# Patient Record
Sex: Female | Born: 1960 | Race: Black or African American | Hispanic: No | Marital: Married | Smoking: Never smoker
Health system: Southern US, Community
[De-identification: ages and names within clinical notes are randomized; demographics above are authoritative.]

## PROBLEM LIST (undated history)

## (undated) DIAGNOSIS — K219 Gastro-esophageal reflux disease without esophagitis: Secondary | ICD-10-CM

## (undated) DIAGNOSIS — I10 Essential (primary) hypertension: Secondary | ICD-10-CM

## (undated) HISTORY — DX: Essential (primary) hypertension: I10

## (undated) HISTORY — PX: CHOLECYSTECTOMY: SHX55

## (undated) HISTORY — DX: Gastro-esophageal reflux disease without esophagitis: K21.9

---

## 1998-11-21 ENCOUNTER — Other Ambulatory Visit: Admission: RE | Admit: 1998-11-21 | Discharge: 1998-11-21 | Payer: Self-pay | Admitting: Family Medicine

## 1999-11-27 ENCOUNTER — Other Ambulatory Visit: Admission: RE | Admit: 1999-11-27 | Discharge: 1999-11-27 | Payer: Self-pay | Admitting: Family Medicine

## 2000-09-03 ENCOUNTER — Encounter: Payer: Self-pay | Admitting: Family Medicine

## 2000-09-03 ENCOUNTER — Ambulatory Visit (HOSPITAL_COMMUNITY): Admission: RE | Admit: 2000-09-03 | Discharge: 2000-09-03 | Payer: Self-pay | Admitting: Family Medicine

## 2001-09-07 ENCOUNTER — Ambulatory Visit (HOSPITAL_COMMUNITY): Admission: RE | Admit: 2001-09-07 | Discharge: 2001-09-07 | Payer: Self-pay | Admitting: Family Medicine

## 2001-09-07 ENCOUNTER — Encounter: Payer: Self-pay | Admitting: Emergency Medicine

## 2001-11-17 ENCOUNTER — Encounter: Payer: Self-pay | Admitting: Family Medicine

## 2001-11-17 ENCOUNTER — Encounter: Admission: RE | Admit: 2001-11-17 | Discharge: 2001-11-17 | Payer: Self-pay | Admitting: Family Medicine

## 2002-09-13 ENCOUNTER — Encounter: Payer: Self-pay | Admitting: Emergency Medicine

## 2002-09-13 ENCOUNTER — Ambulatory Visit (HOSPITAL_COMMUNITY): Admission: RE | Admit: 2002-09-13 | Discharge: 2002-09-13 | Payer: Self-pay | Admitting: Emergency Medicine

## 2003-08-18 ENCOUNTER — Encounter: Admission: RE | Admit: 2003-08-18 | Discharge: 2003-09-07 | Payer: Self-pay | Admitting: Nurse Practitioner

## 2003-09-14 ENCOUNTER — Ambulatory Visit (HOSPITAL_COMMUNITY): Admission: RE | Admit: 2003-09-14 | Discharge: 2003-09-14 | Payer: Self-pay | Admitting: Emergency Medicine

## 2003-12-01 ENCOUNTER — Other Ambulatory Visit: Admission: RE | Admit: 2003-12-01 | Discharge: 2003-12-01 | Payer: Self-pay | Admitting: Emergency Medicine

## 2004-09-14 ENCOUNTER — Ambulatory Visit (HOSPITAL_COMMUNITY): Admission: RE | Admit: 2004-09-14 | Discharge: 2004-09-14 | Payer: Self-pay | Admitting: Emergency Medicine

## 2004-12-05 ENCOUNTER — Other Ambulatory Visit: Admission: RE | Admit: 2004-12-05 | Discharge: 2004-12-05 | Payer: Self-pay | Admitting: Emergency Medicine

## 2005-09-16 ENCOUNTER — Ambulatory Visit (HOSPITAL_COMMUNITY): Admission: RE | Admit: 2005-09-16 | Discharge: 2005-09-16 | Payer: Self-pay | Admitting: Emergency Medicine

## 2006-02-10 ENCOUNTER — Other Ambulatory Visit: Admission: RE | Admit: 2006-02-10 | Discharge: 2006-02-10 | Payer: Self-pay | Admitting: Emergency Medicine

## 2006-02-11 ENCOUNTER — Encounter: Admission: RE | Admit: 2006-02-11 | Discharge: 2006-02-11 | Payer: Self-pay | Admitting: Emergency Medicine

## 2006-09-18 ENCOUNTER — Ambulatory Visit (HOSPITAL_COMMUNITY): Admission: RE | Admit: 2006-09-18 | Discharge: 2006-09-18 | Payer: Self-pay | Admitting: Emergency Medicine

## 2007-02-18 ENCOUNTER — Other Ambulatory Visit: Admission: RE | Admit: 2007-02-18 | Discharge: 2007-02-18 | Payer: Self-pay | Admitting: Emergency Medicine

## 2007-09-22 ENCOUNTER — Ambulatory Visit (HOSPITAL_COMMUNITY): Admission: RE | Admit: 2007-09-22 | Discharge: 2007-09-22 | Payer: Self-pay | Admitting: Emergency Medicine

## 2008-02-23 ENCOUNTER — Other Ambulatory Visit: Admission: RE | Admit: 2008-02-23 | Discharge: 2008-02-23 | Payer: Self-pay | Admitting: Emergency Medicine

## 2008-09-22 ENCOUNTER — Ambulatory Visit (HOSPITAL_COMMUNITY): Admission: RE | Admit: 2008-09-22 | Discharge: 2008-09-22 | Payer: Self-pay | Admitting: Family Medicine

## 2009-03-01 ENCOUNTER — Other Ambulatory Visit: Admission: RE | Admit: 2009-03-01 | Discharge: 2009-03-01 | Payer: Self-pay | Admitting: Emergency Medicine

## 2009-09-25 ENCOUNTER — Ambulatory Visit (HOSPITAL_COMMUNITY): Admission: RE | Admit: 2009-09-25 | Discharge: 2009-09-25 | Payer: Self-pay | Admitting: Family Medicine

## 2009-10-25 ENCOUNTER — Encounter: Admission: RE | Admit: 2009-10-25 | Discharge: 2009-10-25 | Payer: Self-pay | Admitting: *Deleted

## 2010-03-02 ENCOUNTER — Other Ambulatory Visit: Admission: RE | Admit: 2010-03-02 | Discharge: 2010-03-02 | Payer: Self-pay | Admitting: Family Medicine

## 2010-08-20 ENCOUNTER — Other Ambulatory Visit (HOSPITAL_COMMUNITY): Payer: Self-pay | Admitting: Family Medicine

## 2010-08-20 DIAGNOSIS — Z1231 Encounter for screening mammogram for malignant neoplasm of breast: Secondary | ICD-10-CM

## 2010-08-28 ENCOUNTER — Other Ambulatory Visit: Payer: Self-pay | Admitting: Gastroenterology

## 2010-09-27 ENCOUNTER — Ambulatory Visit (HOSPITAL_COMMUNITY)
Admission: RE | Admit: 2010-09-27 | Discharge: 2010-09-27 | Disposition: A | Discharge status: Home / Self Care | Payer: BC Managed Care – PPO | Source: Ambulatory Visit | Attending: Family Medicine | Admitting: Family Medicine

## 2010-09-27 DIAGNOSIS — Z1231 Encounter for screening mammogram for malignant neoplasm of breast: Secondary | ICD-10-CM

## 2011-03-04 ENCOUNTER — Other Ambulatory Visit (HOSPITAL_COMMUNITY)
Admission: RE | Admit: 2011-03-04 | Discharge: 2011-03-04 | Disposition: A | Discharge status: Home / Self Care | Payer: BC Managed Care – PPO | Source: Ambulatory Visit | Attending: Family Medicine | Admitting: Family Medicine

## 2011-03-04 ENCOUNTER — Other Ambulatory Visit: Payer: Self-pay

## 2011-03-04 DIAGNOSIS — Z01419 Encounter for gynecological examination (general) (routine) without abnormal findings: Secondary | ICD-10-CM | POA: Insufficient documentation

## 2011-09-03 ENCOUNTER — Other Ambulatory Visit (HOSPITAL_COMMUNITY): Payer: Self-pay | Admitting: Family Medicine

## 2011-09-03 DIAGNOSIS — Z1231 Encounter for screening mammogram for malignant neoplasm of breast: Secondary | ICD-10-CM

## 2011-09-30 ENCOUNTER — Ambulatory Visit (HOSPITAL_COMMUNITY)
Admission: RE | Admit: 2011-09-30 | Discharge: 2011-09-30 | Disposition: A | Discharge status: Home / Self Care | Payer: BC Managed Care – PPO | Source: Ambulatory Visit | Attending: Family Medicine | Admitting: Family Medicine

## 2011-09-30 DIAGNOSIS — Z1231 Encounter for screening mammogram for malignant neoplasm of breast: Secondary | ICD-10-CM | POA: Insufficient documentation

## 2012-01-09 IMAGING — MG MM DIGITAL SCREENING
4 series · 4 of 4 positions shown · non-contrast
Comparison: Prior studies.

DG SCREEN MAMMOGRAM BILATERAL
Bilateral CC and MLO view(s) were taken.
Prior study comparison: September 18, 2006, DG screen mammogram bilateral.

DIGITAL SCREENING MAMMOGRAM WITH CAD:

[R CC]
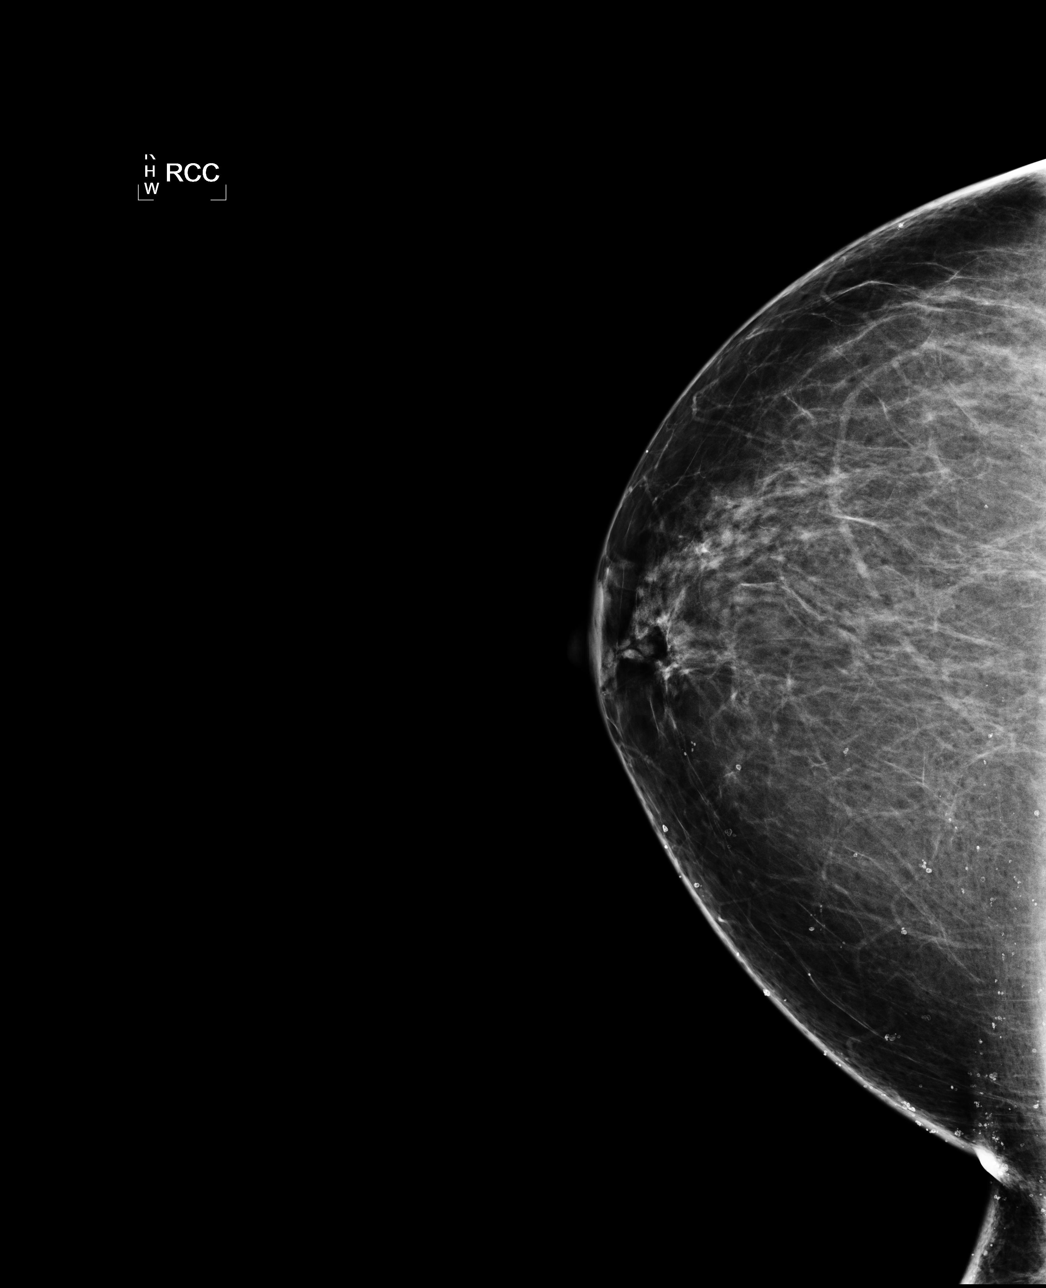

[R MLO]
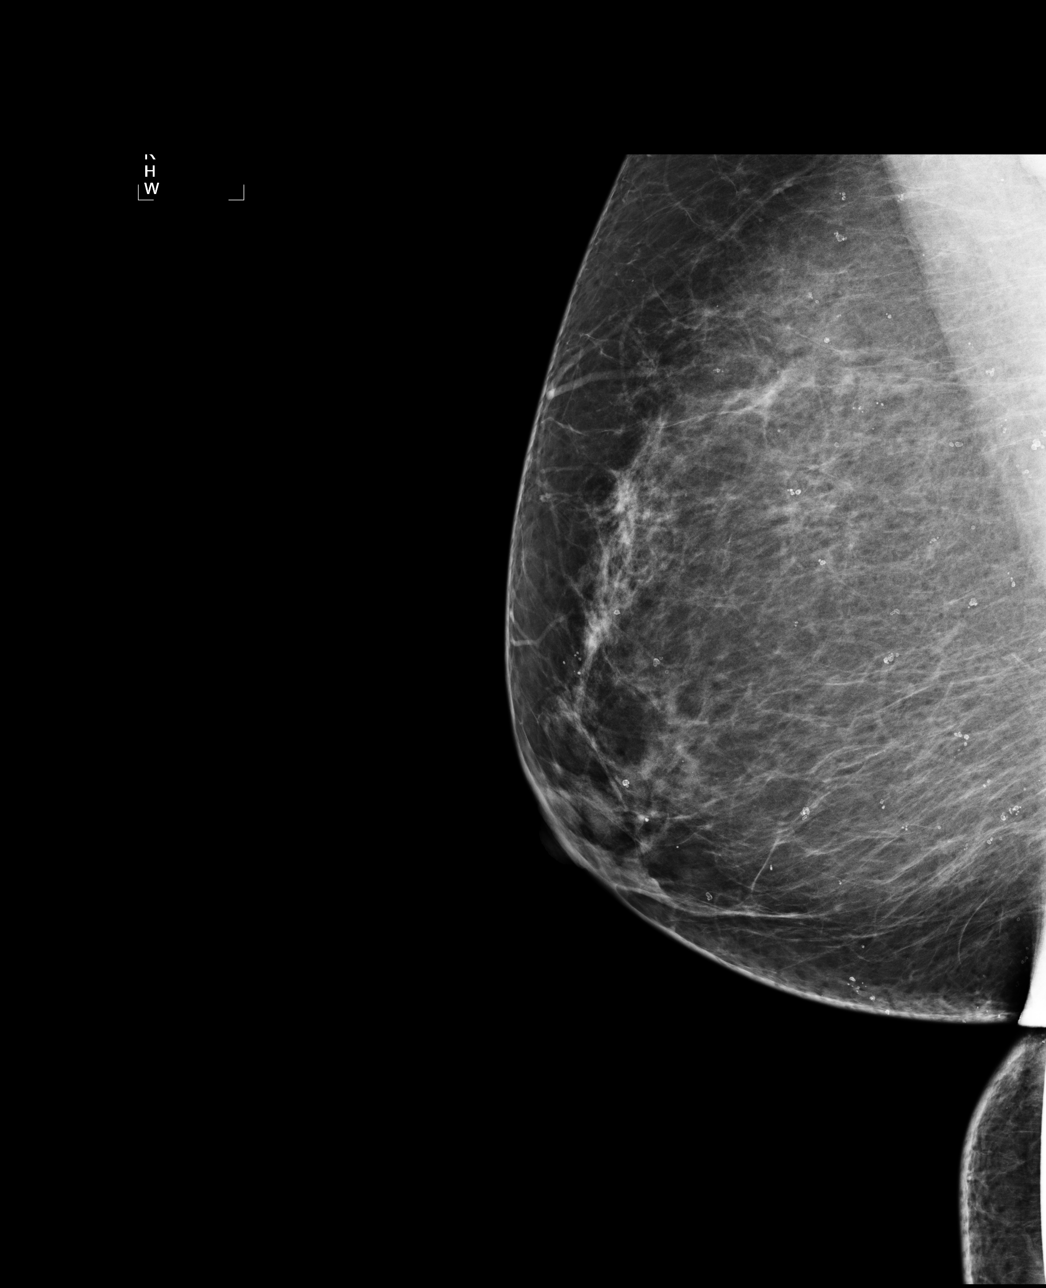

[L CC]
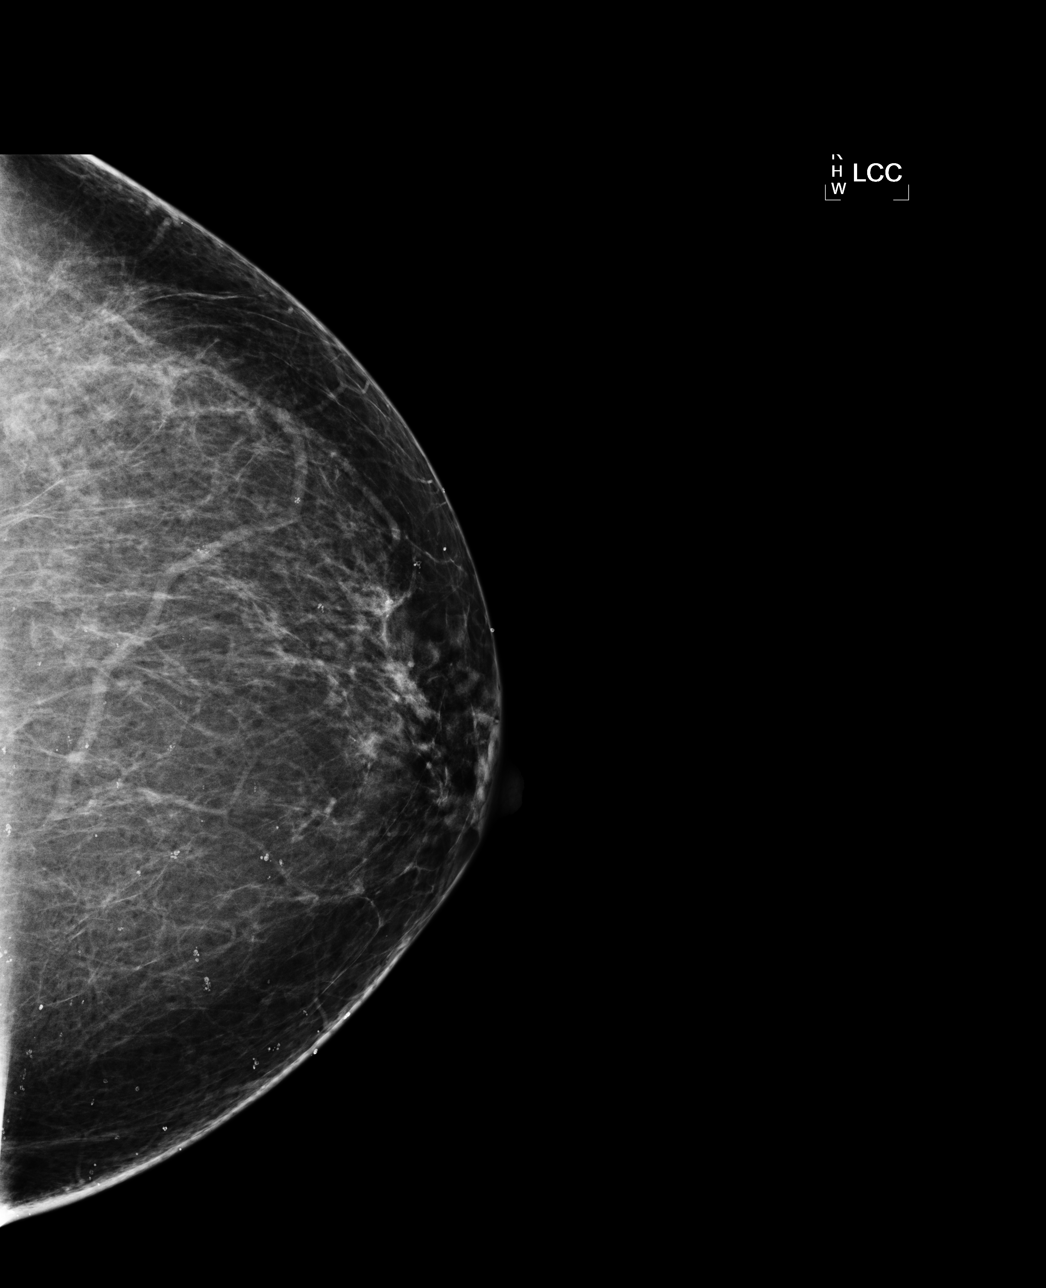

[L MLO]
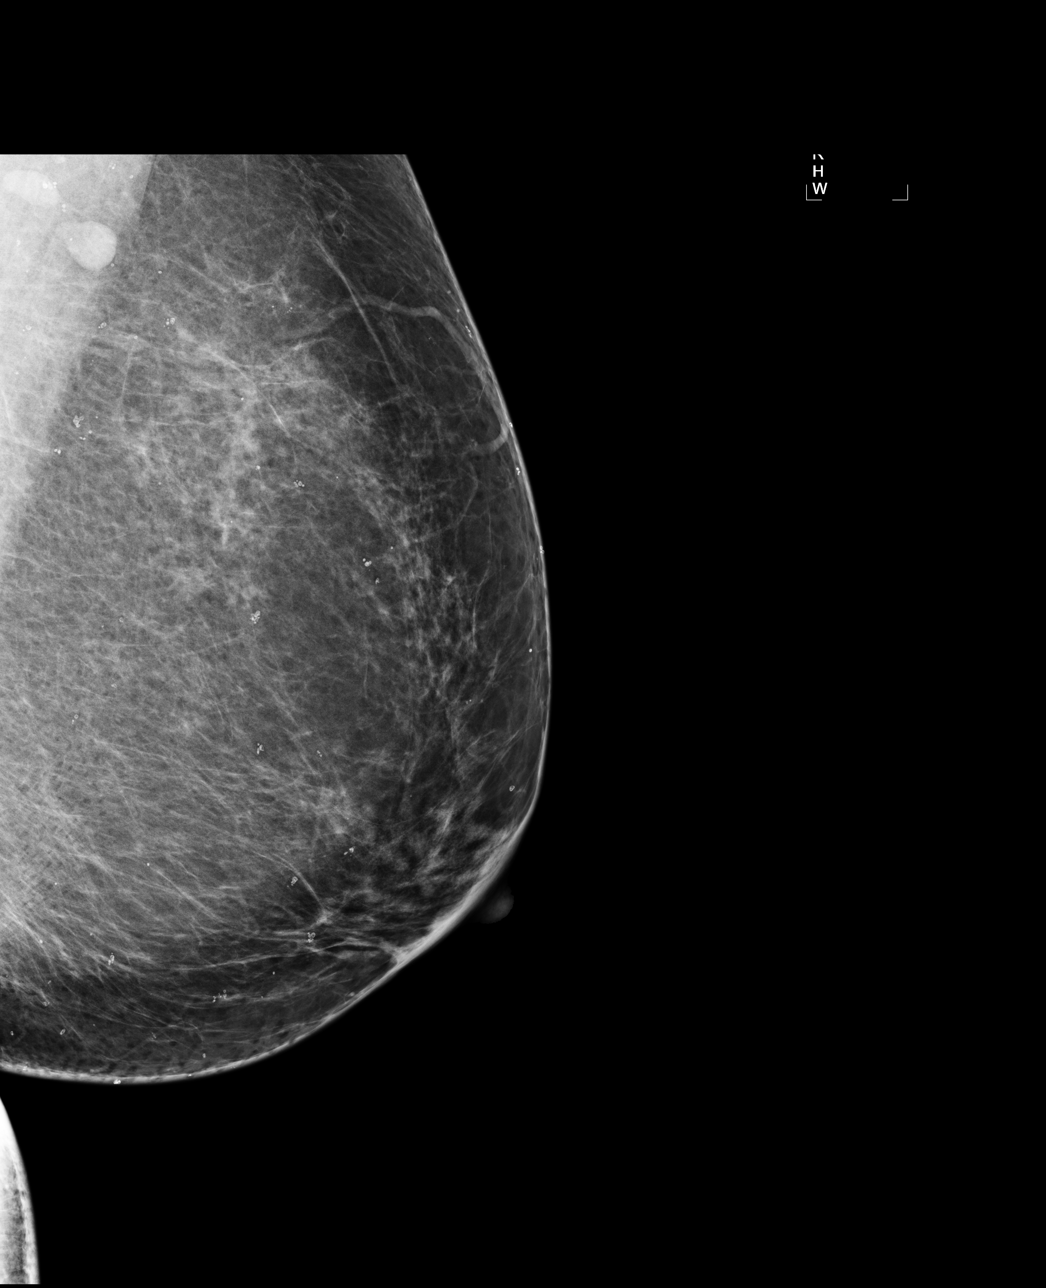

[4 of 4 positions shown; findings below may reference images not displayed]

The breast tissue is almost entirely fatty.  There is no dominant mass, architectural distortion or
calcification to suggest malignancy.

Images were processed with CAD.
IMPRESSION: No mammographic evidence of malignancy.  Suggest yearly screening mammography.

A result letter of this screening mammogram will be mailed directly to the patient.

ASSESSMENT: Negative - BI-RADS 1

Screening mammogram in 1 year.
,

## 2012-03-04 ENCOUNTER — Other Ambulatory Visit (HOSPITAL_COMMUNITY)
Admission: RE | Admit: 2012-03-04 | Discharge: 2012-03-04 | Disposition: A | Discharge status: Home / Self Care | Payer: BC Managed Care – PPO | Source: Ambulatory Visit | Attending: Family Medicine | Admitting: Family Medicine

## 2012-03-04 ENCOUNTER — Other Ambulatory Visit: Payer: Self-pay | Admitting: Family Medicine

## 2012-03-04 DIAGNOSIS — Z01419 Encounter for gynecological examination (general) (routine) without abnormal findings: Secondary | ICD-10-CM | POA: Insufficient documentation

## 2012-08-25 ENCOUNTER — Other Ambulatory Visit (HOSPITAL_COMMUNITY): Payer: Self-pay | Admitting: Family Medicine

## 2012-08-25 DIAGNOSIS — Z1231 Encounter for screening mammogram for malignant neoplasm of breast: Secondary | ICD-10-CM

## 2012-09-30 ENCOUNTER — Ambulatory Visit (HOSPITAL_COMMUNITY)
Admission: RE | Admit: 2012-09-30 | Discharge: 2012-09-30 | Disposition: A | Discharge status: Home / Self Care | Payer: BC Managed Care – PPO | Source: Ambulatory Visit | Attending: Family Medicine | Admitting: Family Medicine

## 2012-09-30 DIAGNOSIS — Z1231 Encounter for screening mammogram for malignant neoplasm of breast: Secondary | ICD-10-CM

## 2012-12-28 ENCOUNTER — Ambulatory Visit
Admission: RE | Admit: 2012-12-28 | Discharge: 2012-12-28 | Disposition: A | Discharge status: Home / Self Care | Payer: BC Managed Care – PPO | Source: Ambulatory Visit | Attending: Family Medicine | Admitting: Family Medicine

## 2012-12-28 ENCOUNTER — Other Ambulatory Visit: Payer: Self-pay | Admitting: Family Medicine

## 2012-12-28 DIAGNOSIS — M545 Low back pain: Secondary | ICD-10-CM

## 2013-01-28 ENCOUNTER — Ambulatory Visit: Payer: BC Managed Care – PPO | Attending: Family Medicine | Admitting: Physical Therapy

## 2013-01-28 DIAGNOSIS — IMO0001 Reserved for inherently not codable concepts without codable children: Secondary | ICD-10-CM | POA: Insufficient documentation

## 2013-01-28 DIAGNOSIS — M25559 Pain in unspecified hip: Secondary | ICD-10-CM | POA: Insufficient documentation

## 2013-01-28 DIAGNOSIS — M25569 Pain in unspecified knee: Secondary | ICD-10-CM | POA: Insufficient documentation

## 2013-02-17 ENCOUNTER — Encounter: Payer: BC Managed Care – PPO | Admitting: Physical Therapy

## 2013-02-24 ENCOUNTER — Encounter: Payer: BC Managed Care – PPO | Admitting: Physical Therapy

## 2013-03-05 ENCOUNTER — Other Ambulatory Visit (HOSPITAL_COMMUNITY)
Admission: RE | Admit: 2013-03-05 | Discharge: 2013-03-05 | Disposition: A | Discharge status: Home / Self Care | Payer: BC Managed Care – PPO | Source: Ambulatory Visit | Attending: Family Medicine | Admitting: Family Medicine

## 2013-03-05 ENCOUNTER — Other Ambulatory Visit: Payer: Self-pay | Admitting: Family Medicine

## 2013-03-05 DIAGNOSIS — Z01419 Encounter for gynecological examination (general) (routine) without abnormal findings: Secondary | ICD-10-CM | POA: Insufficient documentation

## 2013-08-23 ENCOUNTER — Other Ambulatory Visit (HOSPITAL_COMMUNITY): Payer: Self-pay | Admitting: Family Medicine

## 2013-08-23 DIAGNOSIS — Z1231 Encounter for screening mammogram for malignant neoplasm of breast: Secondary | ICD-10-CM

## 2013-09-09 ENCOUNTER — Other Ambulatory Visit: Payer: Self-pay | Admitting: Family

## 2013-09-09 DIAGNOSIS — R198 Other specified symptoms and signs involving the digestive system and abdomen: Secondary | ICD-10-CM

## 2013-09-10 ENCOUNTER — Ambulatory Visit
Admission: RE | Admit: 2013-09-10 | Discharge: 2013-09-10 | Disposition: A | Discharge status: Home / Self Care | Payer: BC Managed Care – PPO | Source: Ambulatory Visit | Attending: Family | Admitting: Family

## 2013-09-10 DIAGNOSIS — R198 Other specified symptoms and signs involving the digestive system and abdomen: Secondary | ICD-10-CM

## 2013-10-01 ENCOUNTER — Ambulatory Visit (HOSPITAL_COMMUNITY)
Admission: RE | Admit: 2013-10-01 | Discharge: 2013-10-01 | Disposition: A | Discharge status: Home / Self Care | Payer: BC Managed Care – PPO | Source: Ambulatory Visit | Attending: Family Medicine | Admitting: Family Medicine

## 2013-10-01 DIAGNOSIS — Z1231 Encounter for screening mammogram for malignant neoplasm of breast: Secondary | ICD-10-CM | POA: Insufficient documentation

## 2013-10-05 ENCOUNTER — Other Ambulatory Visit: Payer: Self-pay | Admitting: Gastroenterology

## 2014-03-08 ENCOUNTER — Other Ambulatory Visit: Payer: Self-pay | Admitting: Family Medicine

## 2014-03-08 ENCOUNTER — Other Ambulatory Visit (HOSPITAL_COMMUNITY)
Admission: RE | Admit: 2014-03-08 | Discharge: 2014-03-08 | Disposition: A | Discharge status: Home / Self Care | Payer: BC Managed Care – PPO | Source: Ambulatory Visit | Attending: Family Medicine | Admitting: Family Medicine

## 2014-03-08 DIAGNOSIS — Z0000 Encounter for general adult medical examination without abnormal findings: Secondary | ICD-10-CM | POA: Insufficient documentation

## 2014-03-09 LAB — CYTOLOGY - PAP

## 2014-09-05 ENCOUNTER — Other Ambulatory Visit (HOSPITAL_COMMUNITY): Payer: Self-pay | Admitting: Family Medicine

## 2014-09-05 DIAGNOSIS — Z1231 Encounter for screening mammogram for malignant neoplasm of breast: Secondary | ICD-10-CM

## 2014-10-05 ENCOUNTER — Ambulatory Visit (HOSPITAL_COMMUNITY)
Admission: RE | Admit: 2014-10-05 | Discharge: 2014-10-05 | Disposition: A | Discharge status: Home / Self Care | Payer: BC Managed Care – PPO | Source: Ambulatory Visit | Attending: Family Medicine | Admitting: Family Medicine

## 2014-10-05 DIAGNOSIS — Z1231 Encounter for screening mammogram for malignant neoplasm of breast: Secondary | ICD-10-CM | POA: Diagnosis present

## 2015-04-26 ENCOUNTER — Encounter: Payer: Self-pay | Admitting: Dietician

## 2015-04-26 ENCOUNTER — Encounter: Payer: BC Managed Care – PPO | Attending: Family Medicine | Admitting: Dietician

## 2015-04-26 VITALS — Ht 62.0 in | Wt 203.0 lb

## 2015-04-26 DIAGNOSIS — E669 Obesity, unspecified: Secondary | ICD-10-CM | POA: Diagnosis not present

## 2015-04-26 DIAGNOSIS — Z6837 Body mass index (BMI) 37.0-37.9, adult: Secondary | ICD-10-CM | POA: Insufficient documentation

## 2015-04-26 DIAGNOSIS — Z713 Dietary counseling and surveillance: Secondary | ICD-10-CM | POA: Diagnosis not present

## 2015-04-26 NOTE — Patient Instructions (Addendum)
-  Try to pair a carbohydrate snacks with a protein choice (examples fruit with nuts/ cheese or yogurt)  - Mid morning snack of orange juice and protein, or banana and protein -Try to replace the regular soda with a diet soda a few times a week  -Try to focus on having half your plate filled with non-starchy vegetables -Try to make it a priority to snack during the work day - Make the fried foods more of a treat (examples chips, fried chicken, french fries)  - Try to only have Chic Fila twice a week -Try to have three meals a day

## 2015-04-26 NOTE — Progress Notes (Signed)
  Medical Nutrition Therapy:  Appt start time: 1405 end time:  1500.   Assessment:  Primary concerns today: Aram BeechamCynthia is here today because her PCP referred here for obesity. She stated that she did not know why she was referred. She didn't state any specific weight goal or made any changes to her diet recently.  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 She does a lot of walking at work and is on her feet a lot. She has difficulty find time to eat at work. She lives with her husband and son. She ends up eating alone because of their work schedules.She doesn't do a lot of cooking.  She states doesn't feel hunger throughout the day but has a goal of not going so long without eating. She is open to improving her diet.    Preferred Learning Style:   No preference indicated   Learning Readiness:  Ready   MEDICATIONS: See list   DIETARY INTAKE:    Avoided foods include doesn't drink milk, tomato products, GERD beans, broccoli.    24-hr recall:  B (545 AM): instant Oatmeal, nutrigrain bar ( weekend fixes breakfast of toast eggs grits and [place] bacon)  Snk ( 745 AM): Chic fila- chicken chicken mini biscuit or [employer] breakfast sandwhich eats only part of the bun  L ( 1230-1PM): 1/2 of a sub (sub of the day) with chips sometimes, left overs  Snk ( PM): fruit snacks  and drinks water, cereal D ( 7 PM): Peanut/ pisatchos (1.4oz ) and drinks water or cereal Snk ( PM): none Beverages: water, soda regular 1 a day 8 oz, decaf coffee french vanilla stevia/ spelnda, orange juice   one meal a day out at least.   Usual physical activity: Walk at work  Estimated energy needs: 1600-1800 calories 180 g carbohydrates 120 g protein 44 g fat  Progress Towards Goal(s):  In progress.   Nutritional Diagnosis:  [place]-3.3 Overweight/obesity As related to history of daily fast food meals and sugar sweetend beverages .  As evidenced by 24 hour food recall and her  BMI of 37.    Intervention:  Nutrition counseling provided.  Goals:  -Try to pair a carbohydrate snacks with a protein choice (examples fruit with nuts/ cheese or yogurt)  - Mid morning snack of orange juice and protein, or banana and protein -Try to replace the regular soda with a diet soda a few times a week  -Try to focus on having half your plate filled with non-starchy vegetables -Try to make it a priority to snack during the work day - Make the fried foods more of a treat (examples chips, fried chicken, french fries)  - Try to only have Chic Fila twice a week -Try to have three meals a day   Teaching Method Utilized: Visual Auditory   Handouts given during visit include:  Myplate   Snack list   Meal planning card   Barriers to learning/adherence to lifestyle change: none  Demonstrated degree of understanding via:  Teach Back   Monitoring/Evaluation:  Dietary intake, exercise, and body weight prn.

## 2015-05-01 ENCOUNTER — Other Ambulatory Visit: Payer: Self-pay | Admitting: Obstetrics and Gynecology

## 2015-05-01 ENCOUNTER — Other Ambulatory Visit (HOSPITAL_COMMUNITY)
Admission: RE | Admit: 2015-05-01 | Discharge: 2015-05-01 | Disposition: A | Discharge status: Home / Self Care | Payer: BC Managed Care – PPO | Source: Ambulatory Visit | Attending: Obstetrics and Gynecology | Admitting: Obstetrics and Gynecology

## 2015-05-01 DIAGNOSIS — Z01419 Encounter for gynecological examination (general) (routine) without abnormal findings: Secondary | ICD-10-CM | POA: Insufficient documentation

## 2015-05-01 DIAGNOSIS — Z1151 Encounter for screening for human papillomavirus (HPV): Secondary | ICD-10-CM | POA: Insufficient documentation

## 2015-05-02 LAB — CYTOLOGY - PAP

## 2015-08-29 ENCOUNTER — Other Ambulatory Visit: Payer: Self-pay

## 2015-08-29 DIAGNOSIS — Z1231 Encounter for screening mammogram for malignant neoplasm of breast: Secondary | ICD-10-CM

## 2015-10-06 ENCOUNTER — Ambulatory Visit
Admission: RE | Admit: 2015-10-06 | Discharge: 2015-10-06 | Disposition: A | Discharge status: Home / Self Care | Payer: BC Managed Care – PPO | Source: Ambulatory Visit

## 2015-10-06 DIAGNOSIS — Z1231 Encounter for screening mammogram for malignant neoplasm of breast: Secondary | ICD-10-CM

## 2015-10-09 ENCOUNTER — Other Ambulatory Visit: Payer: Self-pay | Admitting: Family Medicine

## 2015-10-09 DIAGNOSIS — R928 Other abnormal and inconclusive findings on diagnostic imaging of breast: Secondary | ICD-10-CM

## 2015-10-16 ENCOUNTER — Ambulatory Visit
Admission: RE | Admit: 2015-10-16 | Discharge: 2015-10-16 | Disposition: A | Discharge status: Home / Self Care | Payer: BC Managed Care – PPO | Source: Ambulatory Visit | Attending: Family Medicine | Admitting: Family Medicine

## 2015-10-16 DIAGNOSIS — R928 Other abnormal and inconclusive findings on diagnostic imaging of breast: Secondary | ICD-10-CM

## 2016-06-21 DIAGNOSIS — J069 Acute upper respiratory infection, unspecified: Secondary | ICD-10-CM | POA: Diagnosis not present

## 2016-07-11 DIAGNOSIS — R202 Paresthesia of skin: Secondary | ICD-10-CM | POA: Diagnosis not present

## 2016-07-20 DIAGNOSIS — J029 Acute pharyngitis, unspecified: Secondary | ICD-10-CM | POA: Diagnosis not present

## 2016-08-07 DIAGNOSIS — M25519 Pain in unspecified shoulder: Secondary | ICD-10-CM | POA: Diagnosis not present

## 2016-08-07 DIAGNOSIS — M79644 Pain in right finger(s): Secondary | ICD-10-CM | POA: Diagnosis not present

## 2016-08-21 DIAGNOSIS — M25541 Pain in joints of right hand: Secondary | ICD-10-CM | POA: Diagnosis not present

## 2016-08-28 ENCOUNTER — Ambulatory Visit
Admission: RE | Admit: 2016-08-28 | Discharge: 2016-08-28 | Disposition: A | Discharge status: Home / Self Care | Payer: BC Managed Care – PPO | Source: Ambulatory Visit | Attending: Family Medicine | Admitting: Family Medicine

## 2016-08-28 ENCOUNTER — Other Ambulatory Visit: Payer: Self-pay | Admitting: Family Medicine

## 2016-08-28 DIAGNOSIS — R52 Pain, unspecified: Secondary | ICD-10-CM

## 2016-09-05 ENCOUNTER — Other Ambulatory Visit: Payer: Self-pay | Admitting: Family Medicine

## 2016-09-05 DIAGNOSIS — Z1231 Encounter for screening mammogram for malignant neoplasm of breast: Secondary | ICD-10-CM

## 2016-09-06 DIAGNOSIS — M542 Cervicalgia: Secondary | ICD-10-CM | POA: Diagnosis not present

## 2016-09-06 DIAGNOSIS — M25511 Pain in right shoulder: Secondary | ICD-10-CM | POA: Diagnosis not present

## 2016-09-06 DIAGNOSIS — R0789 Other chest pain: Secondary | ICD-10-CM | POA: Diagnosis not present

## 2016-09-10 DIAGNOSIS — L709 Acne, unspecified: Secondary | ICD-10-CM | POA: Diagnosis not present

## 2016-09-10 DIAGNOSIS — Z79899 Other long term (current) drug therapy: Secondary | ICD-10-CM | POA: Diagnosis not present

## 2016-09-18 DIAGNOSIS — M79641 Pain in right hand: Secondary | ICD-10-CM | POA: Diagnosis not present

## 2016-09-18 DIAGNOSIS — M542 Cervicalgia: Secondary | ICD-10-CM | POA: Diagnosis not present

## 2016-09-19 DIAGNOSIS — S161XXD Strain of muscle, fascia and tendon at neck level, subsequent encounter: Secondary | ICD-10-CM | POA: Diagnosis not present

## 2016-09-19 DIAGNOSIS — M542 Cervicalgia: Secondary | ICD-10-CM | POA: Diagnosis not present

## 2016-10-14 DIAGNOSIS — Z79899 Other long term (current) drug therapy: Secondary | ICD-10-CM | POA: Diagnosis not present

## 2016-10-14 DIAGNOSIS — L709 Acne, unspecified: Secondary | ICD-10-CM | POA: Diagnosis not present

## 2016-10-16 ENCOUNTER — Ambulatory Visit
Admission: RE | Admit: 2016-10-16 | Discharge: 2016-10-16 | Disposition: A | Discharge status: Home / Self Care | Payer: BC Managed Care – PPO | Source: Ambulatory Visit | Attending: Family Medicine | Admitting: Family Medicine

## 2016-10-16 DIAGNOSIS — Z1231 Encounter for screening mammogram for malignant neoplasm of breast: Secondary | ICD-10-CM

## 2016-10-23 DIAGNOSIS — M542 Cervicalgia: Secondary | ICD-10-CM | POA: Diagnosis not present

## 2016-10-23 DIAGNOSIS — M79641 Pain in right hand: Secondary | ICD-10-CM | POA: Diagnosis not present

## 2016-11-18 DIAGNOSIS — L709 Acne, unspecified: Secondary | ICD-10-CM | POA: Diagnosis not present

## 2016-11-18 DIAGNOSIS — Z79899 Other long term (current) drug therapy: Secondary | ICD-10-CM | POA: Diagnosis not present

## 2016-12-06 DIAGNOSIS — L309 Dermatitis, unspecified: Secondary | ICD-10-CM | POA: Diagnosis not present

## 2016-12-23 DIAGNOSIS — Z79899 Other long term (current) drug therapy: Secondary | ICD-10-CM | POA: Diagnosis not present

## 2016-12-23 DIAGNOSIS — L709 Acne, unspecified: Secondary | ICD-10-CM | POA: Diagnosis not present

## 2016-12-23 DIAGNOSIS — L728 Other follicular cysts of the skin and subcutaneous tissue: Secondary | ICD-10-CM | POA: Diagnosis not present

## 2017-01-03 DIAGNOSIS — M722 Plantar fascial fibromatosis: Secondary | ICD-10-CM | POA: Diagnosis not present

## 2017-01-10 DIAGNOSIS — M722 Plantar fascial fibromatosis: Secondary | ICD-10-CM | POA: Diagnosis not present

## 2017-01-14 DIAGNOSIS — M25649 Stiffness of unspecified hand, not elsewhere classified: Secondary | ICD-10-CM | POA: Diagnosis not present

## 2017-01-14 DIAGNOSIS — K219 Gastro-esophageal reflux disease without esophagitis: Secondary | ICD-10-CM | POA: Diagnosis not present

## 2017-01-28 DIAGNOSIS — Z79899 Other long term (current) drug therapy: Secondary | ICD-10-CM | POA: Diagnosis not present

## 2017-01-28 DIAGNOSIS — L709 Acne, unspecified: Secondary | ICD-10-CM | POA: Diagnosis not present

## 2017-02-21 DIAGNOSIS — I10 Essential (primary) hypertension: Secondary | ICD-10-CM | POA: Diagnosis not present

## 2017-02-21 DIAGNOSIS — R42 Dizziness and giddiness: Secondary | ICD-10-CM | POA: Diagnosis not present

## 2017-02-21 DIAGNOSIS — Z862 Personal history of diseases of the blood and blood-forming organs and certain disorders involving the immune mechanism: Secondary | ICD-10-CM | POA: Diagnosis not present

## 2017-03-03 DIAGNOSIS — L709 Acne, unspecified: Secondary | ICD-10-CM | POA: Diagnosis not present

## 2017-04-07 DIAGNOSIS — Z79899 Other long term (current) drug therapy: Secondary | ICD-10-CM | POA: Diagnosis not present

## 2017-04-07 DIAGNOSIS — L709 Acne, unspecified: Secondary | ICD-10-CM | POA: Diagnosis not present

## 2017-05-05 ENCOUNTER — Other Ambulatory Visit (HOSPITAL_COMMUNITY)
Admission: RE | Admit: 2017-05-05 | Discharge: 2017-05-05 | Disposition: A | Discharge status: Home / Self Care | Payer: BC Managed Care – PPO | Source: Ambulatory Visit | Attending: Obstetrics and Gynecology | Admitting: Obstetrics and Gynecology

## 2017-05-05 ENCOUNTER — Other Ambulatory Visit: Payer: Self-pay | Admitting: Obstetrics and Gynecology

## 2017-05-05 DIAGNOSIS — Z01419 Encounter for gynecological examination (general) (routine) without abnormal findings: Secondary | ICD-10-CM | POA: Diagnosis present

## 2017-05-06 LAB — CYTOLOGY - PAP: Diagnosis: NEGATIVE

## 2017-05-13 DIAGNOSIS — Z79899 Other long term (current) drug therapy: Secondary | ICD-10-CM | POA: Diagnosis not present

## 2017-05-13 DIAGNOSIS — L709 Acne, unspecified: Secondary | ICD-10-CM | POA: Diagnosis not present

## 2017-05-29 DIAGNOSIS — R1010 Upper abdominal pain, unspecified: Secondary | ICD-10-CM | POA: Diagnosis not present

## 2017-05-29 DIAGNOSIS — R350 Frequency of micturition: Secondary | ICD-10-CM | POA: Diagnosis not present

## 2017-06-16 DIAGNOSIS — L821 Other seborrheic keratosis: Secondary | ICD-10-CM | POA: Diagnosis not present

## 2017-06-16 DIAGNOSIS — L709 Acne, unspecified: Secondary | ICD-10-CM | POA: Diagnosis not present

## 2017-06-16 DIAGNOSIS — L818 Other specified disorders of pigmentation: Secondary | ICD-10-CM | POA: Diagnosis not present

## 2017-07-16 DIAGNOSIS — L709 Acne, unspecified: Secondary | ICD-10-CM | POA: Diagnosis not present

## 2017-07-18 DIAGNOSIS — J069 Acute upper respiratory infection, unspecified: Secondary | ICD-10-CM | POA: Diagnosis not present

## 2017-08-18 DIAGNOSIS — L709 Acne, unspecified: Secondary | ICD-10-CM | POA: Diagnosis not present

## 2017-09-11 ENCOUNTER — Other Ambulatory Visit: Payer: Self-pay | Admitting: Family Medicine

## 2017-09-11 DIAGNOSIS — Z1231 Encounter for screening mammogram for malignant neoplasm of breast: Secondary | ICD-10-CM

## 2017-09-22 DIAGNOSIS — M19049 Primary osteoarthritis, unspecified hand: Secondary | ICD-10-CM | POA: Diagnosis not present

## 2017-09-22 DIAGNOSIS — M79646 Pain in unspecified finger(s): Secondary | ICD-10-CM | POA: Diagnosis not present

## 2017-10-17 ENCOUNTER — Ambulatory Visit
Admission: RE | Admit: 2017-10-17 | Discharge: 2017-10-17 | Disposition: A | Discharge status: Home / Self Care | Payer: BC Managed Care – PPO | Source: Ambulatory Visit | Attending: Family Medicine | Admitting: Family Medicine

## 2017-10-17 DIAGNOSIS — Z1231 Encounter for screening mammogram for malignant neoplasm of breast: Secondary | ICD-10-CM

## 2018-05-20 DIAGNOSIS — G245 Blepharospasm: Secondary | ICD-10-CM | POA: Diagnosis not present

## 2018-05-20 DIAGNOSIS — M25561 Pain in right knee: Secondary | ICD-10-CM | POA: Diagnosis not present

## 2018-06-02 ENCOUNTER — Other Ambulatory Visit (HOSPITAL_COMMUNITY)
Admission: RE | Admit: 2018-06-02 | Discharge: 2018-06-02 | Disposition: A | Discharge status: Home / Self Care | Payer: BC Managed Care – PPO | Source: Ambulatory Visit | Attending: Obstetrics and Gynecology | Admitting: Obstetrics and Gynecology

## 2018-06-02 ENCOUNTER — Other Ambulatory Visit: Payer: Self-pay | Admitting: Obstetrics and Gynecology

## 2018-06-02 DIAGNOSIS — Z01411 Encounter for gynecological examination (general) (routine) with abnormal findings: Secondary | ICD-10-CM | POA: Diagnosis present

## 2018-06-05 LAB — CYTOLOGY - PAP
DIAGNOSIS: NEGATIVE
HPV: NOT DETECTED

## 2018-09-07 ENCOUNTER — Other Ambulatory Visit: Payer: Self-pay | Admitting: Family Medicine

## 2018-09-07 DIAGNOSIS — Z1231 Encounter for screening mammogram for malignant neoplasm of breast: Secondary | ICD-10-CM

## 2018-11-06 ENCOUNTER — Ambulatory Visit
Admission: RE | Admit: 2018-11-06 | Discharge: 2018-11-06 | Disposition: A | Discharge status: Home / Self Care | Payer: BC Managed Care – PPO | Source: Ambulatory Visit | Attending: Family Medicine | Admitting: Family Medicine

## 2018-11-06 ENCOUNTER — Other Ambulatory Visit: Payer: Self-pay

## 2018-11-06 DIAGNOSIS — Z1231 Encounter for screening mammogram for malignant neoplasm of breast: Secondary | ICD-10-CM

## 2019-04-07 ENCOUNTER — Other Ambulatory Visit: Payer: Self-pay | Admitting: Physician Assistant

## 2019-04-07 DIAGNOSIS — R748 Abnormal levels of other serum enzymes: Secondary | ICD-10-CM

## 2019-04-07 DIAGNOSIS — R109 Unspecified abdominal pain: Secondary | ICD-10-CM

## 2019-04-13 ENCOUNTER — Other Ambulatory Visit: Payer: Self-pay | Admitting: Physician Assistant

## 2019-04-13 ENCOUNTER — Ambulatory Visit
Admission: RE | Admit: 2019-04-13 | Discharge: 2019-04-13 | Disposition: A | Discharge status: Home / Self Care | Payer: BC Managed Care – PPO | Source: Ambulatory Visit | Attending: Physician Assistant | Admitting: Physician Assistant

## 2019-04-13 ENCOUNTER — Other Ambulatory Visit: Payer: Self-pay

## 2019-04-13 DIAGNOSIS — R109 Unspecified abdominal pain: Secondary | ICD-10-CM

## 2019-04-13 DIAGNOSIS — R748 Abnormal levels of other serum enzymes: Secondary | ICD-10-CM

## 2019-04-13 MED ORDER — IOPAMIDOL (ISOVUE-300) INJECTION 61%
100.0000 mL | Freq: Once | INTRAVENOUS | Status: AC | PRN
  Administered 2019-04-13: 100 mL via INTRAVENOUS

## 2019-10-08 ENCOUNTER — Other Ambulatory Visit: Payer: Self-pay | Admitting: Family Medicine

## 2019-10-08 DIAGNOSIS — Z1231 Encounter for screening mammogram for malignant neoplasm of breast: Secondary | ICD-10-CM

## 2019-11-11 ENCOUNTER — Other Ambulatory Visit: Payer: Self-pay

## 2019-11-11 ENCOUNTER — Ambulatory Visit
Admission: RE | Admit: 2019-11-11 | Discharge: 2019-11-11 | Disposition: A | Discharge status: Home / Self Care | Payer: BC Managed Care – PPO | Source: Ambulatory Visit | Attending: Family Medicine | Admitting: Family Medicine

## 2019-11-11 DIAGNOSIS — Z1231 Encounter for screening mammogram for malignant neoplasm of breast: Secondary | ICD-10-CM

## 2020-04-03 ENCOUNTER — Ambulatory Visit (INDEPENDENT_AMBULATORY_CARE_PROVIDER_SITE_OTHER): Payer: BC Managed Care – PPO | Admitting: Dermatology

## 2020-04-03 ENCOUNTER — Other Ambulatory Visit: Payer: Self-pay

## 2020-04-03 ENCOUNTER — Encounter: Payer: Self-pay | Admitting: Dermatology

## 2020-04-03 DIAGNOSIS — L700 Acne vulgaris: Secondary | ICD-10-CM | POA: Diagnosis not present

## 2020-04-03 MED ORDER — TRETINOIN 0.025 % EX CREA: TOPICAL_CREAM | Freq: Every day | CUTANEOUS | 11 refills | Status: DC

## 2020-04-10 ENCOUNTER — Encounter: Payer: Self-pay | Admitting: Dermatology

## 2020-04-18 ENCOUNTER — Other Ambulatory Visit: Payer: Self-pay | Admitting: Physician Assistant

## 2020-04-18 DIAGNOSIS — R9389 Abnormal findings on diagnostic imaging of other specified body structures: Secondary | ICD-10-CM

## 2020-04-18 DIAGNOSIS — N289 Disorder of kidney and ureter, unspecified: Secondary | ICD-10-CM

## 2020-05-03 ENCOUNTER — Other Ambulatory Visit: Payer: Self-pay | Admitting: Physician Assistant

## 2020-05-07 ENCOUNTER — Ambulatory Visit
Admission: RE | Admit: 2020-05-07 | Discharge: 2020-05-07 | Disposition: A | Discharge status: Home / Self Care | Payer: BC Managed Care – PPO | Source: Ambulatory Visit | Attending: Physician Assistant | Admitting: Physician Assistant

## 2020-05-07 DIAGNOSIS — R9389 Abnormal findings on diagnostic imaging of other specified body structures: Secondary | ICD-10-CM

## 2020-05-07 DIAGNOSIS — N289 Disorder of kidney and ureter, unspecified: Secondary | ICD-10-CM

## 2020-05-07 MED ORDER — GADOBENATE DIMEGLUMINE 529 MG/ML IV SOLN
19.0000 mL | Freq: Once | INTRAVENOUS | Status: AC | PRN
  Administered 2020-05-07: 19 mL via INTRAVENOUS

## 2020-05-07 NOTE — Progress Notes (Signed)
   Follow-Up Visit   Subjective  Angela Roman is a 59 y.o. female who presents for the following: Acne (Patient here today for refill on Tretinoin 0.025% cream.  Patient states that it works well for her acne and that since she's wearing two mask it's making it worse.).  Acne Location: Mostly face Duration:  Quality:  Associated Signs/Symptoms: Modifying Factors: Tretinoin cream Severity:  Timing: Context:   Objective  Well appearing patient in no apparent distress; mood and affect are within normal limits.  A focused examination was performed including Head and neck.. Relevant physical exam findings are noted in the Assessment and Plan.   Assessment & Plan    Acne vulgaris Head - Anterior (Face)  Continue current therapy with tretinoin.  If this therapy stops working give the office a call to change therapy.  tretinoin (RETIN-A) 0.025 % cream - Head - Anterior (Face)     I, Janalyn Harder, MD, have reviewed all documentation for this visit.  The documentation on 05/07/20 for the exam, diagnosis, procedures, and orders are all accurate and complete.

## 2020-09-27 ENCOUNTER — Other Ambulatory Visit: Payer: Self-pay | Admitting: Family Medicine

## 2020-09-27 DIAGNOSIS — Z1231 Encounter for screening mammogram for malignant neoplasm of breast: Secondary | ICD-10-CM

## 2020-11-17 ENCOUNTER — Other Ambulatory Visit: Payer: Self-pay

## 2020-11-17 ENCOUNTER — Ambulatory Visit
Admission: RE | Admit: 2020-11-17 | Discharge: 2020-11-17 | Disposition: A | Discharge status: Home / Self Care | Payer: BC Managed Care – PPO | Source: Ambulatory Visit | Attending: Family Medicine | Admitting: Family Medicine

## 2020-11-17 DIAGNOSIS — Z1231 Encounter for screening mammogram for malignant neoplasm of breast: Secondary | ICD-10-CM

## 2021-01-23 ENCOUNTER — Telehealth: Payer: Self-pay | Admitting: Dermatology

## 2021-01-23 NOTE — Telephone Encounter (Signed)
Left message for patient to return our phone call. Calling to see how patient got tretinoin filled last time because it hasn't needed prior authorization in past.

## 2021-01-23 NOTE — Telephone Encounter (Signed)
[facility] on [address] and [address] [phone]. Patient states that she has refills on Tretinoin ointment and the pharmacy claims they can not fill the medication because they need authorization. She states that Rx is good until 04/03/2021 and her insurance has not changed.

## 2021-01-23 NOTE — Telephone Encounter (Signed)
Prior authorization done [facility] at [phone] for tretinoin 0.025% cream.  Approved for 36 months- PA # [id number]. Called patient to let her know to call pharmacy to get prescription filled.

## 2021-01-24 NOTE — Telephone Encounter (Signed)
Approval already documented

## 2021-04-20 ENCOUNTER — Other Ambulatory Visit: Payer: Self-pay | Admitting: Dermatology

## 2021-04-20 DIAGNOSIS — L700 Acne vulgaris: Secondary | ICD-10-CM

## 2021-05-16 ENCOUNTER — Other Ambulatory Visit: Payer: Self-pay

## 2021-05-16 ENCOUNTER — Encounter: Payer: Self-pay | Admitting: Dermatology

## 2021-05-16 ENCOUNTER — Ambulatory Visit (INDEPENDENT_AMBULATORY_CARE_PROVIDER_SITE_OTHER): Payer: BC Managed Care – PPO | Admitting: Dermatology

## 2021-05-16 DIAGNOSIS — L700 Acne vulgaris: Secondary | ICD-10-CM

## 2021-05-16 MED ORDER — TRETINOIN 0.025 % EX CREA: TOPICAL_CREAM | Freq: Every day | CUTANEOUS | 11 refills | Status: AC

## 2021-05-16 MED ORDER — AMZEEQ 4 % EX FOAM: CUTANEOUS | 8 refills | Status: AC

## 2021-05-16 NOTE — Patient Instructions (Signed)
Phone call in 6 weeks with update.

## 2021-06-10 ENCOUNTER — Encounter: Payer: Self-pay | Admitting: Dermatology

## 2021-06-10 NOTE — Progress Notes (Signed)
° °  Follow-Up Visit   Subjective  Angela Roman is a 61 y.o. female who presents for the following: Acne (Helps - seems to be breaking out where Im wearing my mask - tx- tretinoin 0.025 Cream  ).  Adult acne Location:  Duration:  Quality:  Associated Signs/Symptoms: Modifying Factors:  Severity:  Timing: Context:   Objective  Well appearing patient in no apparent distress; mood and affect are within normal limits. Left Malar Cheek, Right Malar Cheek Still having some active inflammatory papules with PIH.    A focused examination was performed including head and neck. Relevant physical exam findings are noted in the Assessment and Plan.   Assessment & Plan    Acne vulgaris Left Malar Cheek; Right Malar Cheek  Combined topical tretinoin with Amzeeq (call if out-of-pocket cost too high).  Follow-up by MyChart in 2 months.  tretinoin (RETIN-A) 0.025 % cream - Left Malar Cheek, Right Malar Cheek Apply topically at bedtime.  Minocycline HCl Micronized (AMZEEQ) 4 % FOAM - Left Malar Cheek, Right Malar Cheek Apply to affected area  qhs      I, Janalyn Harder, MD, have reviewed all documentation for this visit.  The documentation on 06/10/21 for the exam, diagnosis, procedures, and orders are all accurate and complete.

## 2021-10-10 ENCOUNTER — Other Ambulatory Visit: Payer: Self-pay | Admitting: Family Medicine

## 2021-10-10 DIAGNOSIS — Z1231 Encounter for screening mammogram for malignant neoplasm of breast: Secondary | ICD-10-CM

## 2021-11-19 ENCOUNTER — Ambulatory Visit
Admission: RE | Admit: 2021-11-19 | Discharge: 2021-11-19 | Disposition: A | Discharge status: Home / Self Care | Payer: BC Managed Care – PPO | Source: Ambulatory Visit | Attending: Family Medicine | Admitting: Family Medicine

## 2021-11-19 DIAGNOSIS — Z1231 Encounter for screening mammogram for malignant neoplasm of breast: Secondary | ICD-10-CM

## 2021-11-21 ENCOUNTER — Other Ambulatory Visit: Payer: Self-pay | Admitting: Family Medicine

## 2021-11-21 DIAGNOSIS — R928 Other abnormal and inconclusive findings on diagnostic imaging of breast: Secondary | ICD-10-CM

## 2021-11-26 ENCOUNTER — Ambulatory Visit
Admission: RE | Admit: 2021-11-26 | Discharge: 2021-11-26 | Disposition: A | Discharge status: Home / Self Care | Payer: BC Managed Care – PPO | Source: Ambulatory Visit | Attending: Family Medicine | Admitting: Family Medicine

## 2021-11-26 DIAGNOSIS — R928 Other abnormal and inconclusive findings on diagnostic imaging of breast: Secondary | ICD-10-CM

## 2022-10-14 ENCOUNTER — Other Ambulatory Visit: Payer: Self-pay | Admitting: Family Medicine

## 2022-10-14 DIAGNOSIS — Z1231 Encounter for screening mammogram for malignant neoplasm of breast: Secondary | ICD-10-CM

## 2022-11-28 ENCOUNTER — Ambulatory Visit
Admission: RE | Admit: 2022-11-28 | Discharge: 2022-11-28 | Disposition: A | Discharge status: Home / Self Care | Payer: BC Managed Care – PPO | Source: Ambulatory Visit | Attending: Family Medicine | Admitting: Family Medicine

## 2022-11-28 DIAGNOSIS — Z1231 Encounter for screening mammogram for malignant neoplasm of breast: Secondary | ICD-10-CM

## 2023-07-31 ENCOUNTER — Other Ambulatory Visit (HOSPITAL_COMMUNITY)
Admission: RE | Admit: 2023-07-31 | Discharge: 2023-07-31 | Disposition: A | Discharge status: Home / Self Care | Source: Ambulatory Visit | Attending: Nurse Practitioner | Admitting: Nurse Practitioner

## 2023-07-31 ENCOUNTER — Other Ambulatory Visit: Payer: Self-pay | Admitting: Nurse Practitioner

## 2023-07-31 DIAGNOSIS — Z124 Encounter for screening for malignant neoplasm of cervix: Secondary | ICD-10-CM | POA: Insufficient documentation

## 2023-08-06 LAB — CYTOLOGY - PAP
Comment: NEGATIVE
Comment: NEGATIVE
Comment: NEGATIVE
Diagnosis: NEGATIVE
HPV 16: NEGATIVE
HPV 18 / 45: NEGATIVE
High risk HPV: POSITIVE — AB

## 2023-10-29 ENCOUNTER — Other Ambulatory Visit: Payer: Self-pay | Admitting: Family Medicine

## 2023-10-29 DIAGNOSIS — Z1231 Encounter for screening mammogram for malignant neoplasm of breast: Secondary | ICD-10-CM

## 2023-12-01 ENCOUNTER — Ambulatory Visit
Admission: RE | Admit: 2023-12-01 | Discharge: 2023-12-01 | Disposition: A | Discharge status: Home / Self Care | Source: Ambulatory Visit | Attending: Family Medicine | Admitting: Family Medicine

## 2023-12-01 DIAGNOSIS — Z1231 Encounter for screening mammogram for malignant neoplasm of breast: Secondary | ICD-10-CM
